# Patient Record
Sex: Female | Born: 1990 | Race: Black or African American | Hispanic: No | Marital: Single | Smoking: Never smoker
Health system: Southern US, Community
[De-identification: ages and names within clinical notes are randomized; demographics above are authoritative.]

## PROBLEM LIST (undated history)

## (undated) DIAGNOSIS — O1490 Unspecified pre-eclampsia, unspecified trimester: Secondary | ICD-10-CM

## (undated) DIAGNOSIS — I2699 Other pulmonary embolism without acute cor pulmonale: Secondary | ICD-10-CM

## (undated) HISTORY — PX: ORTHOPEDIC SURGERY: SHX850

---

## 2012-07-11 ENCOUNTER — Emergency Department: Payer: Self-pay | Admitting: Emergency Medicine

## 2012-07-11 LAB — BASIC METABOLIC PANEL
BUN: 8 mg/dL (ref 7–18)
Calcium, Total: 8.7 mg/dL — ABNORMAL LOW (ref 9.0–10.7)
Chloride: 106 mmol/L (ref 98–107)
Creatinine: 0.69 mg/dL (ref 0.60–1.30)
EGFR (African American): >60
EGFR (Non-African Amer.): >60
Glucose: 106 mg/dL — ABNORMAL HIGH (ref 65–99)
Osmolality: 274 (ref 275–301)
Potassium: 4.1 mmol/L (ref 3.5–5.1)
Sodium: 138 mmol/L (ref 136–145)

## 2012-07-11 LAB — CBC
HCT: 27.6 % — ABNORMAL LOW (ref 35.0–47.0)
HGB: 9.6 g/dL — ABNORMAL LOW (ref 12.0–16.0)
MCV: 88 fL (ref 80–100)
RBC: 3.12 10*6/uL — ABNORMAL LOW (ref 3.80–5.20)
WBC: 7.8 10*3/uL (ref 3.6–11.0)

## 2012-07-11 LAB — CK TOTAL AND CKMB (NOT AT ARMC)
CK, Total: 2133 U/L — ABNORMAL HIGH (ref 21–215)
CK-MB: <0.5 ng/mL — ABNORMAL LOW (ref 0.5–3.6)

## 2012-07-12 ENCOUNTER — Emergency Department: Payer: Self-pay | Admitting: Emergency Medicine

## 2012-07-12 LAB — BASIC METABOLIC PANEL
Calcium, Total: 9 mg/dL (ref 9.0–10.7)
Chloride: 105 mmol/L (ref 98–107)
Co2: 25 mmol/L (ref 21–32)
Creatinine: 0.71 mg/dL (ref 0.60–1.30)
EGFR (African American): >60
EGFR (Non-African Amer.): >60
Glucose: 103 mg/dL — ABNORMAL HIGH (ref 65–99)
Osmolality: 272 (ref 275–301)

## 2012-07-12 LAB — CBC
MCH: 29.8 pg (ref 26.0–34.0)
MCV: 89 fL (ref 80–100)
Platelet: 322 10*3/uL (ref 150–440)
RBC: 3.42 10*6/uL — ABNORMAL LOW (ref 3.80–5.20)

## 2012-07-12 LAB — PROTIME-INR
INR: 0.9
Prothrombin Time: 12.8 secs (ref 11.5–14.7)

## 2012-07-12 LAB — APTT: Activated PTT: 25.8 secs (ref 23.6–35.9)

## 2012-09-21 ENCOUNTER — Ambulatory Visit: Payer: Self-pay | Admitting: Family Medicine

## 2012-09-21 LAB — RAPID INFLUENZA A&B ANTIGENS

## 2013-08-08 IMAGING — CT CT CHEST W/ CM
1 series · 15 of 31 positions shown, 19 images · IV contrast (APPLIED)
Comparison: none

REASON FOR EXAM: difficulty breathing, sharp pain in chest
COMMENTS:

PROCEDURE:     CT  - CT CHEST WITH CONTRAST  - July 11, 2012 [DATE]
RESULT:
TECHNIQUE: Helical 3 mm sections were obtained from the thoracic inlet
through the lung bases status post intravenous administration of 100 mL of
Osovue-GDW. This study was also evaluated with [employer]ng[employer]Via 3-D reconstruction
software reconstructed by the interpreting physician.

[Series 4: soft tissue · axial · 0.62mm/px · z∈[-410,-152]mm · 15 of 94 slices shown, 19 images]
[im 4/94  mediastinal]
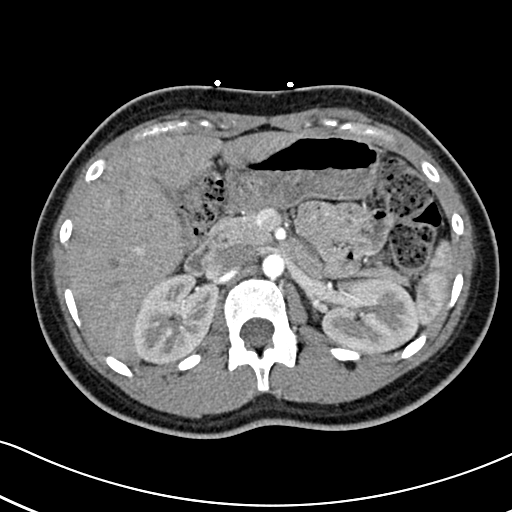
[im 4/94  lung]
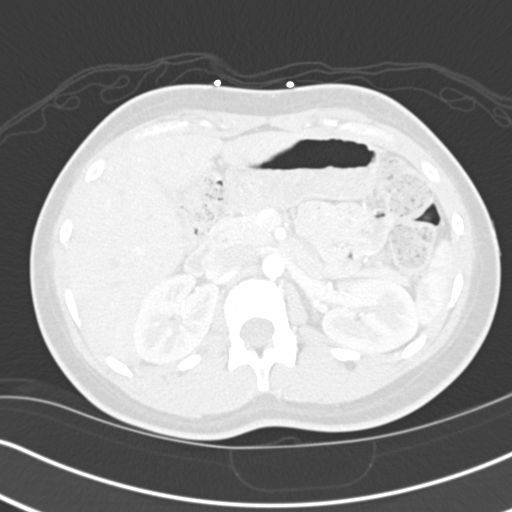
[im 11/94  lung]
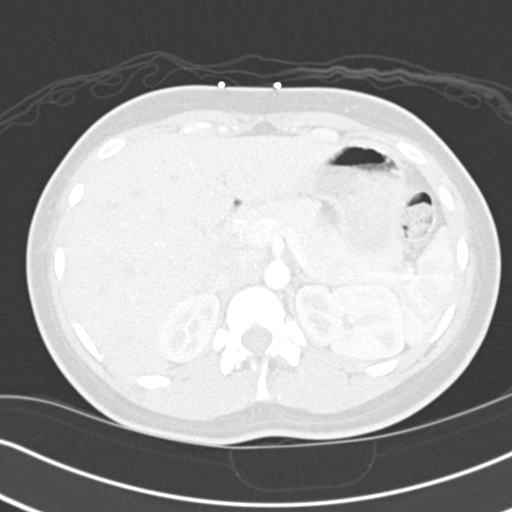
[im 18/94  lung]
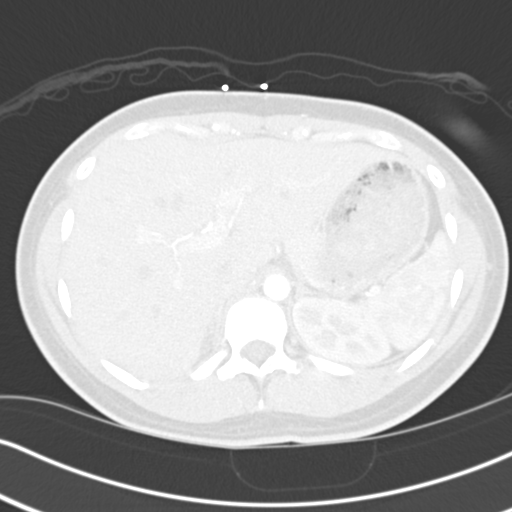
[im 21/94  lung]
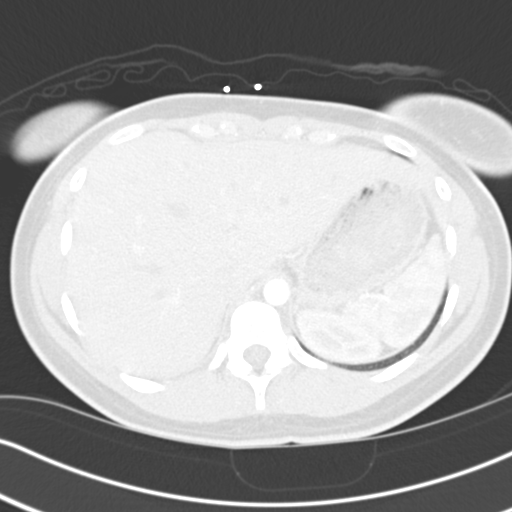
[im 28/94  mediastinal]
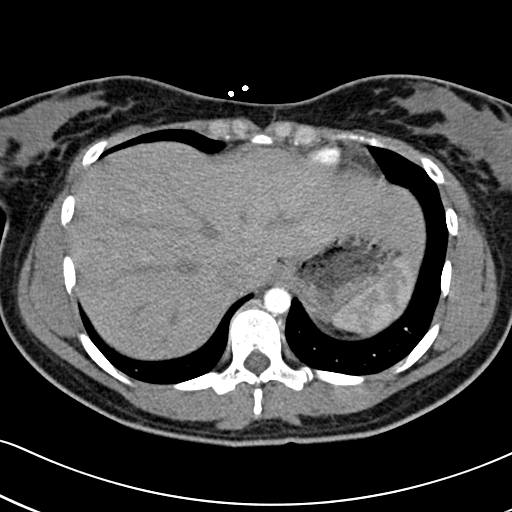
[im 28/94  lung]
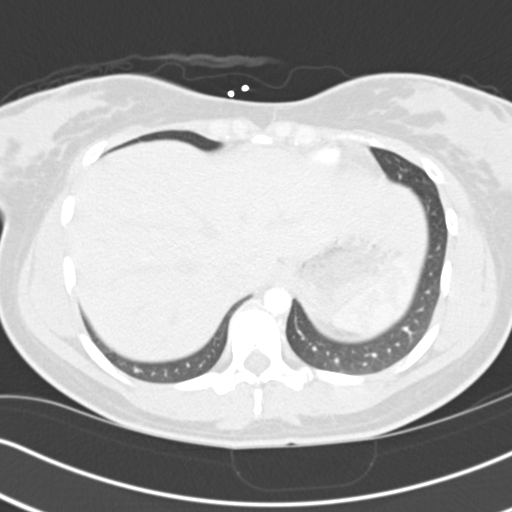
[im 35/94  lung]
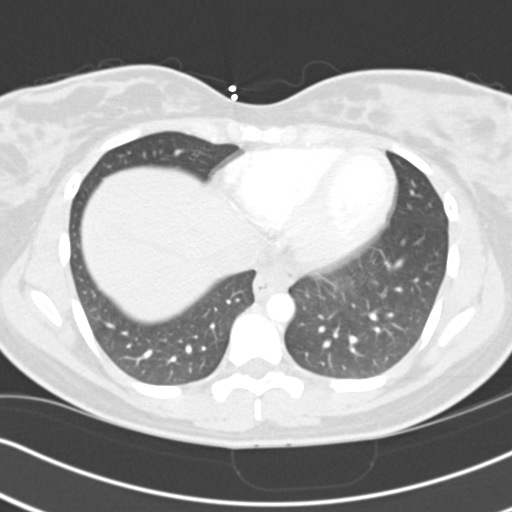
[im 42/94  lung]
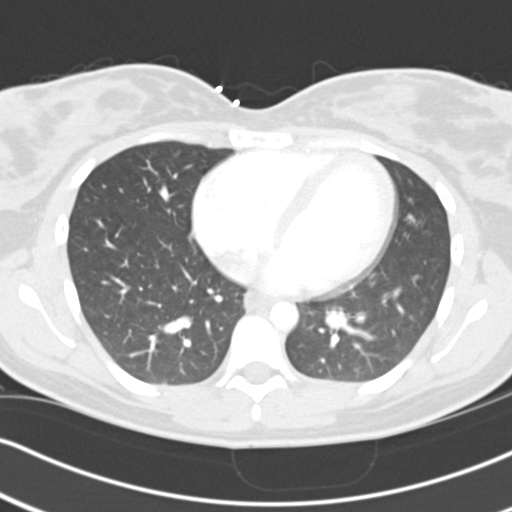
[im 49/94  lung]
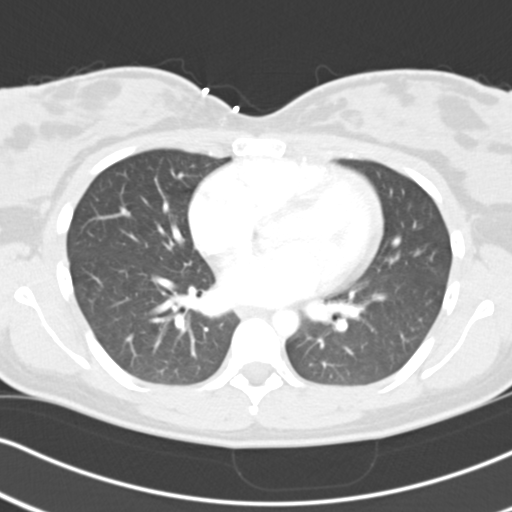
[im 52/94  mediastinal]
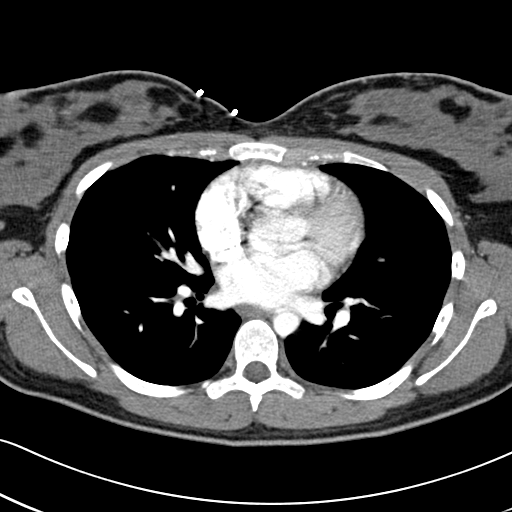
[im 52/94  lung]
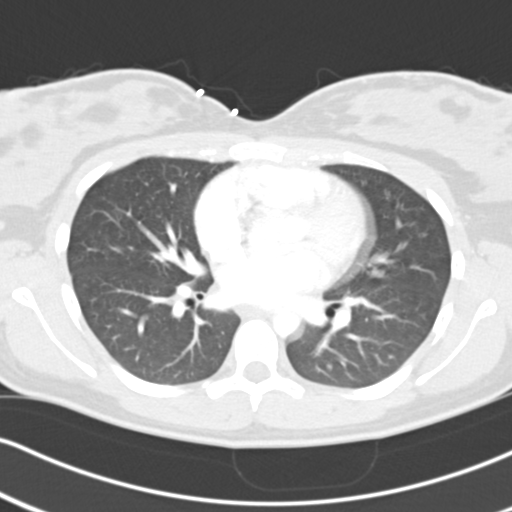
[im 59/94  lung]
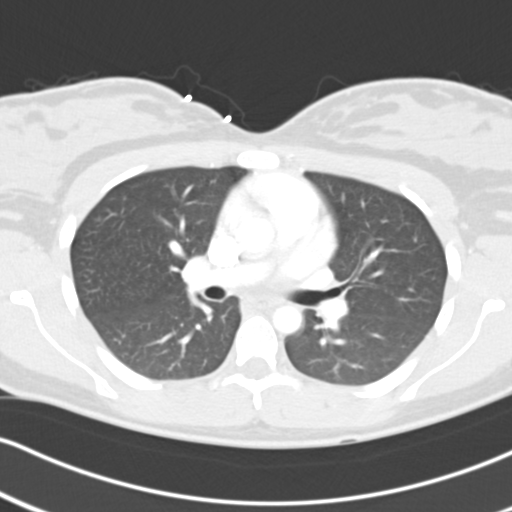
[im 66/94  lung]
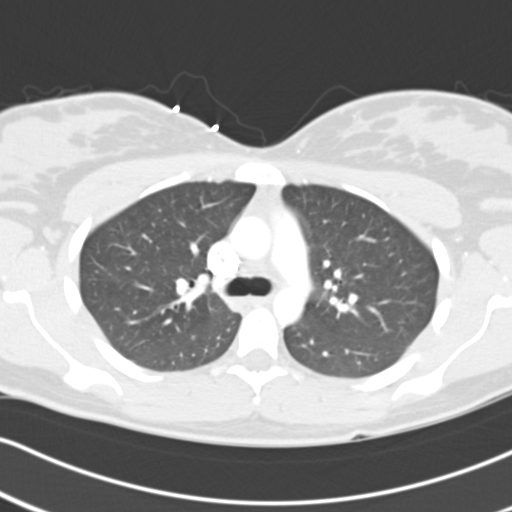
[im 73/94  lung]
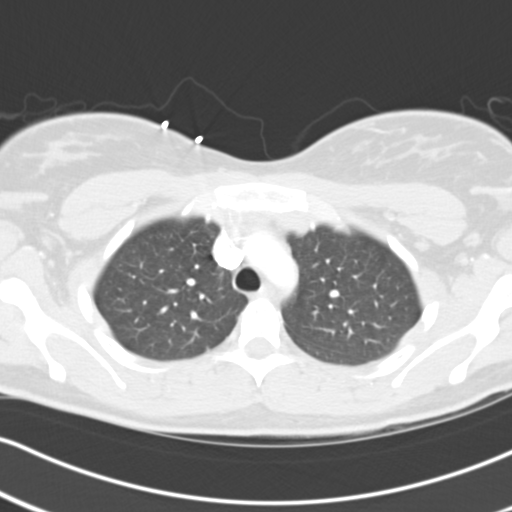
[im 76/94  mediastinal]
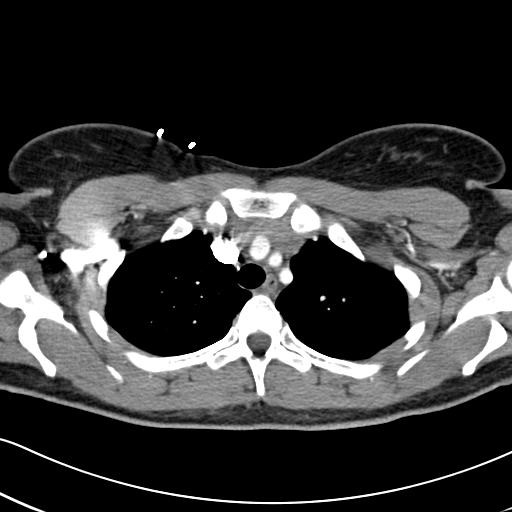
[im 76/94  lung]
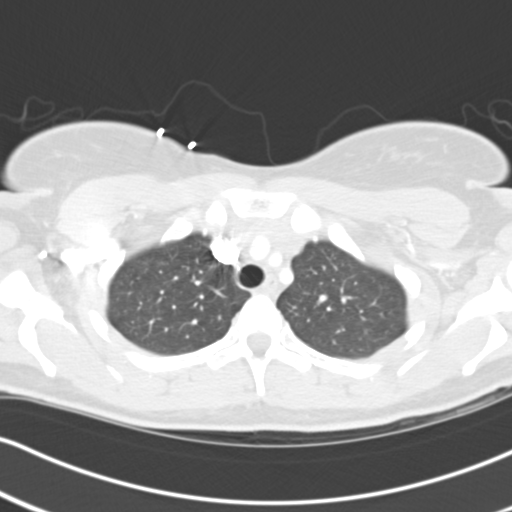
[im 83/94  lung]
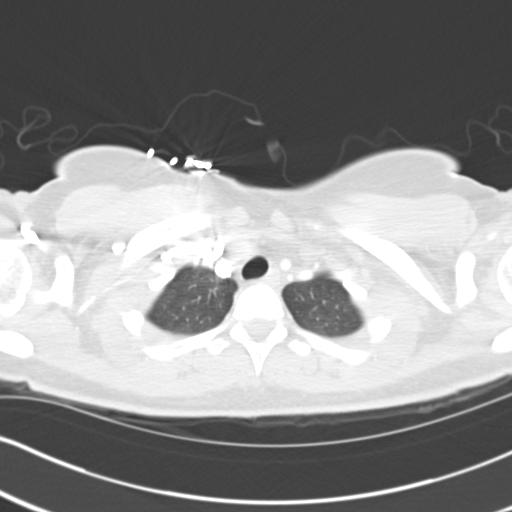
[im 90/94  lung]
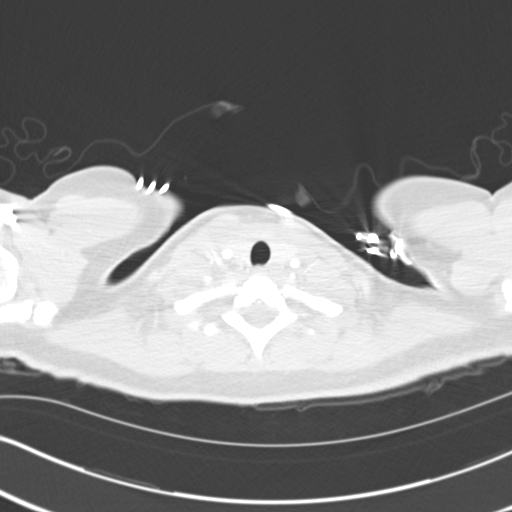

[15 of 31 positions shown; findings below may reference images not displayed]

FINDINGS: The mediastinum and hilar regions and structures demonstrate no
evidence of adenopathy nor masses.

There are findings concerning for a filling defect within a subsegmental
branch of the left lower lobe pulmonary arterial system. This finding was
questioned on the preliminary report and is appreciated on images #[id number]. No
further evidence of filling defects are appreciated within the main, lobar
or segmental pulmonary arteries.

The lung parenchyma demonstrates no evidence of focal infiltrates,
effusions, edema, masses, nor nodules. The visualized upper abdominal
viscera demonstrate no gross abnormalities.

The osseous structures demonstrate no evidence of fracture or dislocation.
There is no evidence of a pneumothorax.
IMPRESSION: 1. Findings concerning for pulmonary arterial embolic disease within a
subsegmental branch of the left lower lobe pulmonary arterial system. This
finding was questioned on preliminary evaluation and thought to represent
decreased enhancement. Upon reevaluation this finding is concerning for
pulmonary arterial embolic disease.
2. Dr. Hirokazu of the Emergency Department was informed of these findings
at the time of this interpretation.

## 2017-09-01 ENCOUNTER — Emergency Department: Payer: No Typology Code available for payment source

## 2017-09-01 ENCOUNTER — Other Ambulatory Visit: Payer: Self-pay

## 2017-09-01 ENCOUNTER — Encounter: Payer: Self-pay | Admitting: Emergency Medicine

## 2017-09-01 ENCOUNTER — Emergency Department
Admission: EM | Admit: 2017-09-01 | Discharge: 2017-09-01 | Disposition: A | Discharge status: Home / Self Care | Payer: No Typology Code available for payment source | Attending: Emergency Medicine | Admitting: Emergency Medicine

## 2017-09-01 DIAGNOSIS — O9989 Other specified diseases and conditions complicating pregnancy, childbirth and the puerperium: Secondary | ICD-10-CM | POA: Diagnosis not present

## 2017-09-01 DIAGNOSIS — R51 Headache: Secondary | ICD-10-CM | POA: Diagnosis not present

## 2017-09-01 DIAGNOSIS — Z041 Encounter for examination and observation following transport accident: Secondary | ICD-10-CM | POA: Diagnosis not present

## 2017-09-01 DIAGNOSIS — Z3492 Encounter for supervision of normal pregnancy, unspecified, second trimester: Secondary | ICD-10-CM

## 2017-09-01 HISTORY — DX: Other pulmonary embolism without acute cor pulmonale: I26.99

## 2017-09-01 HISTORY — DX: Unspecified pre-eclampsia, unspecified trimester: O14.90

## 2017-09-01 LAB — URINALYSIS, COMPLETE (UACMP) WITH MICROSCOPIC
BILIRUBIN URINE: NEGATIVE
Bacteria, UA: NONE SEEN
GLUCOSE, UA: NEGATIVE mg/dL
HGB URINE DIPSTICK: NEGATIVE
KETONES UR: NEGATIVE mg/dL
NITRITE: NEGATIVE
Protein, ur: NEGATIVE mg/dL
Specific Gravity, Urine: 1.02 (ref 1.005–1.030)
pH: 7 (ref 5.0–8.0)

## 2017-09-01 LAB — CBC WITH DIFFERENTIAL/PLATELET
BASOS ABS: 0 10*3/uL (ref 0–0.1)
BASOS PCT: 1 %
EOS ABS: 0.1 10*3/uL (ref 0–0.7)
Eosinophils Relative: 1 %
HCT: 33.5 % — ABNORMAL LOW (ref 35.0–47.0)
HEMOGLOBIN: 11.4 g/dL — AB (ref 12.0–16.0)
LYMPHS ABS: 1.9 10*3/uL (ref 1.0–3.6)
Lymphocytes Relative: 19 %
MCH: 30.6 pg (ref 26.0–34.0)
MCHC: 33.9 g/dL (ref 32.0–36.0)
MCV: 90.3 fL (ref 80.0–100.0)
Monocytes Absolute: 0.6 10*3/uL (ref 0.2–0.9)
Monocytes Relative: 6 %
NEUTROS PCT: 73 %
Neutro Abs: 7.8 10*3/uL — ABNORMAL HIGH (ref 1.4–6.5)
Platelets: 241 10*3/uL (ref 150–440)
RBC: 3.71 MIL/uL — AB (ref 3.80–5.20)
RDW: 13.9 % (ref 11.5–14.5)
WBC: 10.5 10*3/uL (ref 3.6–11.0)

## 2017-09-01 LAB — COMPREHENSIVE METABOLIC PANEL
ALT: 8 U/L — AB (ref 14–54)
AST: 15 U/L (ref 15–41)
Albumin: 3.5 g/dL (ref 3.5–5.0)
Alkaline Phosphatase: 49 U/L (ref 38–126)
Anion gap: 9 (ref 5–15)
BUN: 8 mg/dL (ref 6–20)
CHLORIDE: 103 mmol/L (ref 101–111)
CO2: 23 mmol/L (ref 22–32)
CREATININE: 0.43 mg/dL — AB (ref 0.44–1.00)
Calcium: 9 mg/dL (ref 8.9–10.3)
Glucose, Bld: 99 mg/dL (ref 65–99)
POTASSIUM: 3.9 mmol/L (ref 3.5–5.1)
Sodium: 135 mmol/L (ref 135–145)
Total Bilirubin: 0.8 mg/dL (ref 0.3–1.2)
Total Protein: 7.2 g/dL (ref 6.5–8.1)

## 2017-09-01 LAB — POCT PREGNANCY, URINE: PREG TEST UR: POSITIVE — AB

## 2017-09-01 LAB — ABO/RH: ABO/RH(D): A POS

## 2017-09-01 NOTE — Discharge Instructions (Signed)
the ultrasound and blood work looked normal. Please return for worse headache fever vomiting bad pain anywhere else and for any sensation of fluid leaking vaginal bleeding or belly pain or cramping. Please call your OB doctor land let them know what happened.
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
## 2017-09-01 NOTE — ED Notes (Signed)
Pt ambulatory upon discharge. Verbalized understanding of discharge instructions, follow-up care and s/s of when to return to ER. VSS. Skin warm and dry. A&O x4.
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
